# Patient Record
Sex: Female | Born: 1970 | Hispanic: Yes | Marital: Single | Smoking: Never smoker
Health system: Southern US, Community
[De-identification: ages and names within clinical notes are randomized; demographics above are authoritative.]

## PROBLEM LIST (undated history)

## (undated) DIAGNOSIS — I10 Essential (primary) hypertension: Secondary | ICD-10-CM

---

## 2018-02-22 ENCOUNTER — Other Ambulatory Visit: Payer: Self-pay

## 2018-02-25 LAB — CYTOLOGY - PAP: Diagnosis: NEGATIVE

## 2018-04-01 ENCOUNTER — Other Ambulatory Visit: Payer: Self-pay

## 2018-04-01 ENCOUNTER — Emergency Department (HOSPITAL_COMMUNITY): Payer: No Typology Code available for payment source

## 2018-04-01 ENCOUNTER — Emergency Department (HOSPITAL_COMMUNITY)
Admission: EM | Admit: 2018-04-01 | Discharge: 2018-04-01 | Disposition: A | Discharge status: Home / Self Care | Payer: No Typology Code available for payment source | Attending: Emergency Medicine | Admitting: Emergency Medicine

## 2018-04-01 ENCOUNTER — Encounter (HOSPITAL_COMMUNITY): Payer: Self-pay | Admitting: Emergency Medicine

## 2018-04-01 DIAGNOSIS — Y9389 Activity, other specified: Secondary | ICD-10-CM | POA: Diagnosis not present

## 2018-04-01 DIAGNOSIS — S060X0A Concussion without loss of consciousness, initial encounter: Secondary | ICD-10-CM | POA: Insufficient documentation

## 2018-04-01 DIAGNOSIS — I10 Essential (primary) hypertension: Secondary | ICD-10-CM | POA: Insufficient documentation

## 2018-04-01 DIAGNOSIS — S0990XA Unspecified injury of head, initial encounter: Secondary | ICD-10-CM | POA: Diagnosis present

## 2018-04-01 DIAGNOSIS — Y92410 Unspecified street and highway as the place of occurrence of the external cause: Secondary | ICD-10-CM | POA: Insufficient documentation

## 2018-04-01 DIAGNOSIS — Y999 Unspecified external cause status: Secondary | ICD-10-CM | POA: Diagnosis not present

## 2018-04-01 DIAGNOSIS — M542 Cervicalgia: Secondary | ICD-10-CM | POA: Diagnosis not present

## 2018-04-01 HISTORY — DX: Essential (primary) hypertension: I10

## 2018-04-01 LAB — HCG, QUANTITATIVE, PREGNANCY: hCG, Beta Chain, Quant, S: <1 m[IU]/mL (ref <5)

## 2018-04-01 MED ORDER — ONDANSETRON HCL 4 MG/2ML IJ SOLN
4.0000 mg | Freq: Once | INTRAMUSCULAR | Status: AC
  Administered 2018-04-01: 4 mg via INTRAVENOUS
  Filled 2018-04-01: qty 2

## 2018-04-01 MED ORDER — ONDANSETRON HCL 4 MG PO TABS: 4.0000 mg | ORAL_TABLET | Freq: Three times a day (TID) | ORAL | 0 refills | Status: AC | PRN

## 2018-04-01 MED ORDER — IBUPROFEN 600 MG PO TABS: 600.0000 mg | ORAL_TABLET | Freq: Four times a day (QID) | ORAL | 0 refills | Status: AC | PRN

## 2018-04-01 MED ORDER — METOCLOPRAMIDE HCL 5 MG/ML IJ SOLN
10.0000 mg | Freq: Once | INTRAMUSCULAR | Status: AC
  Administered 2018-04-01: 10 mg via INTRAVENOUS
  Filled 2018-04-01: qty 2

## 2018-04-01 MED ORDER — MORPHINE SULFATE (PF) 4 MG/ML IV SOLN
4.0000 mg | Freq: Once | INTRAVENOUS | Status: AC
  Administered 2018-04-01: 4 mg via INTRAVENOUS
  Filled 2018-04-01: qty 1

## 2018-04-01 MED ORDER — CYCLOBENZAPRINE HCL 10 MG PO TABS: 10.0000 mg | ORAL_TABLET | Freq: Two times a day (BID) | ORAL | 0 refills | Status: AC | PRN

## 2018-04-01 NOTE — ED Notes (Signed)
Pt with 1 episode of emesis 
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
## 2018-04-01 NOTE — ED Notes (Signed)
Patient transported to X-ray 
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
## 2018-04-01 NOTE — ED Notes (Signed)
Patient transported to CT 
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
## 2023-02-05 ENCOUNTER — Other Ambulatory Visit: Payer: Self-pay

## 2023-02-05 DIAGNOSIS — N644 Mastodynia: Secondary | ICD-10-CM

## 2023-02-05 DIAGNOSIS — N649 Disorder of breast, unspecified: Secondary | ICD-10-CM

## 2023-02-06 ENCOUNTER — Other Ambulatory Visit: Payer: Self-pay | Admitting: Obstetrics and Gynecology

## 2023-02-17 ENCOUNTER — Ambulatory Visit: Payer: Self-pay | Admitting: *Deleted

## 2023-02-17 VITALS — BP 149/85 | Ht 61.02 in | Wt 139.0 lb

## 2023-02-17 DIAGNOSIS — N6325 Unspecified lump in the left breast, overlapping quadrants: Secondary | ICD-10-CM

## 2023-02-17 DIAGNOSIS — Z01419 Encounter for gynecological examination (general) (routine) without abnormal findings: Secondary | ICD-10-CM

## 2023-02-17 DIAGNOSIS — Z1211 Encounter for screening for malignant neoplasm of colon: Secondary | ICD-10-CM

## 2023-02-17 DIAGNOSIS — N644 Mastodynia: Secondary | ICD-10-CM

## 2023-02-17 NOTE — Progress Notes (Signed)
Misty Crane is a 52 y.o. No obstetric history on file. female who presents to [facility] clinic today with complaint of bilateral diffuse breast pain x 4 months that comes and goes. Patient rates the pain at a 5 out of 10.    Pap Smear: Pap smear completed today. Last Pap smear was 02/22/2018 at the free cervical cancer screening clinic and was normal with negative HPV. Per patient has history of an abnormal Pap smear 12 years ago in [place] that a colposcopy was completed for follow up. Per patient has had two normal Pap smears since colposcopy. Last Pap smear result is available in Epic.   Physical exam: Breasts Breasts symmetrical. No skin abnormalities bilateral breasts. No nipple retraction bilateral breasts. No nipple discharge bilateral breasts. No lymphadenopathy. No lumps palpated right breast. Palpated a lump left breast at 12 o'clock 2 cm from the nipple. Complaints of bilateral diffuse breast pain on exam.     Pelvic/Bimanual Ext Genitalia No lesions, no swelling and no discharge observed on external genitalia.        Vagina Vagina pink and normal texture. No lesions or discharge observed in vagina.        Cervix Cervix is present. Cervix pink and of normal texture. Cervix friable. No discharge observed.    Uterus Uterus is present and palpable. Uterus in normal position and normal size.        Adnexae Bilateral ovaries present and palpable. No tenderness on palpation.         Rectovaginal No rectal exam completed today since patient had no rectal complaints. No skin abnormalities observed on exam.     Smoking History: Patient has never smoked.   Patient Navigation: Patient education provided. Access to services provided for patient through [facility] program. Spanish interpreter Misty Crane from [facility] provided.   Colorectal Cancer Screening: Per patient has never had colonoscopy completed. FIT Test given to patient to complete. No complaints today.    Breast and  Cervical Cancer Risk Assessment: Patient does not have family history of breast cancer, known genetic mutations, or radiation treatment to the chest before age 53. Per patient has history of cervical dysplasia. Patient has no history of being  immunocompromised or DES exposure in-utero.  Risk Scores as of 02/17/2023     [place]           5-year 0.9 %   Lifetime 7.92 %   This patient is Hispana/Latina but has no documented birth country, so the [place] used data from [place] patients to calculate their risk score. Document a birth country in the Demographics activity for a more accurate score.         Last calculated by Claretha Cooper, CMA on 02/17/2023 at  9:20 AM        A: [facility] exam with pap smear Complaint of bilateral breast pain.  P: Referred patient to the [facility] for a diagnostic mammogram. Appointment scheduled Tuesday, February 24, 2023 at [place].  Loletta Parish, RN 02/17/2023 9:24 AM

## 2023-02-17 NOTE — Patient Instructions (Signed)
Explained breast self awareness with Misty Crane. Pap smear completed today. Let her know [facility] will cover Pap smears and HPV typing every 5 years unless has a history of abnormal Pap smears. Referred patient to the [facility] for a diagnostic mammogram. Appointment scheduled Tuesday, February 24, 2023 at [place]. Patient aware of appointment and will be there. Let patient know will follow up with her within the next couple weeks with results of Pap smear by letter or phone. Misty Crane verbalized understanding.  Misty Crane, Arvil Chaco, RN 9:24 AM

## 2023-02-18 LAB — CYTOLOGY - PAP
Comment: NEGATIVE
Diagnosis: NEGATIVE
High risk HPV: NEGATIVE

## 2023-02-24 ENCOUNTER — Ambulatory Visit: Payer: Self-pay

## 2023-02-24 ENCOUNTER — Ambulatory Visit
Admission: RE | Admit: 2023-02-24 | Discharge: 2023-02-24 | Disposition: A | Discharge status: Home / Self Care | Payer: Self-pay | Source: Ambulatory Visit | Attending: Obstetrics and Gynecology | Admitting: Obstetrics and Gynecology

## 2023-02-24 ENCOUNTER — Ambulatory Visit
Admission: RE | Admit: 2023-02-24 | Discharge: 2023-02-24 | Disposition: A | Discharge status: Home / Self Care | Payer: No Typology Code available for payment source | Source: Ambulatory Visit | Attending: Obstetrics and Gynecology | Admitting: Obstetrics and Gynecology

## 2023-02-24 DIAGNOSIS — N644 Mastodynia: Secondary | ICD-10-CM

## 2023-02-24 DIAGNOSIS — N649 Disorder of breast, unspecified: Secondary | ICD-10-CM
